# Patient Record
Sex: Female | Born: 1975 | Race: White | Hispanic: No | Marital: Married | Smoking: Never smoker
Health system: Southern US, Community
[De-identification: ages and names within clinical notes are randomized; demographics above are authoritative.]

## PROBLEM LIST (undated history)

## (undated) HISTORY — PX: BREAST SURGERY: SHX581

---

## 2010-04-10 ENCOUNTER — Other Ambulatory Visit: Payer: Self-pay | Admitting: Podiatry

## 2010-09-20 ENCOUNTER — Ambulatory Visit: Payer: Self-pay | Admitting: Obstetrics and Gynecology

## 2015-05-21 ENCOUNTER — Ambulatory Visit
Admission: EM | Admit: 2015-05-21 | Discharge: 2015-05-21 | Disposition: A | Discharge status: Home / Self Care | Payer: BC Managed Care – PPO | Attending: Family Medicine | Admitting: Family Medicine

## 2015-05-21 ENCOUNTER — Encounter: Payer: Self-pay | Admitting: *Deleted

## 2015-05-21 DIAGNOSIS — N390 Urinary tract infection, site not specified: Secondary | ICD-10-CM | POA: Diagnosis not present

## 2015-05-21 LAB — URINALYSIS COMPLETE WITH MICROSCOPIC (ARMC ONLY)
Bilirubin Urine: NEGATIVE
Glucose, UA: NEGATIVE mg/dL
Ketones, ur: NEGATIVE mg/dL
Nitrite: NEGATIVE
PH: 7 (ref 5.0–8.0)
SPECIFIC GRAVITY, URINE: 1.01 (ref 1.005–1.030)

## 2015-05-21 MED ORDER — PHENAZOPYRIDINE HCL 200 MG PO TABS: 200.0000 mg | ORAL_TABLET | Freq: Three times a day (TID) | ORAL | Status: AC

## 2015-05-21 MED ORDER — CEPHALEXIN 500 MG PO CAPS: 500.0000 mg | ORAL_CAPSULE | Freq: Two times a day (BID) | ORAL | Status: AC

## 2015-05-21 NOTE — Discharge Instructions (Signed)

## 2015-05-21 NOTE — ED Provider Notes (Signed)
CSN: [id number]     Arrival date & time 05/21/15  1049 History   First MD Initiated Contact with Patient 05/21/15 1109     Chief Complaint  Patient presents with  . Urinary Frequency   (Consider location/radiation/quality/duration/timing/severity/associated sxs/prior Treatment) HPI   This a 40 year old female who presents with urinary frequency with low back pain and burning is the end of micturition. She stated this started 5 days ago. She also feels bloated in the lower abdomen. She has no vaginal discharge. She is sexually active with a single partner.  History reviewed. No pertinent past medical history. History reviewed. No pertinent past surgical history. No family history on file. Social History  Substance Use Topics  . Smoking status: Never Smoker   . Smokeless tobacco: None  . Alcohol Use: No   OB History    No data available     Review of Systems  Constitutional: Positive for chills and activity change. Negative for fever and fatigue.  Genitourinary: Positive for dysuria, urgency, frequency and decreased urine volume. Negative for vaginal bleeding, vaginal discharge and vaginal pain.  All other systems reviewed and are negative.   Allergies  Review of patient's allergies indicates no known allergies.  Home Medications   Prior to Admission medications   Medication Sig Start Date End Date Taking? Authorizing Provider  cetirizine (ZYRTEC) 10 MG tablet Take 10 mg by mouth daily.   Yes Historical Provider, MD  cephALEXin (KEFLEX) 500 MG capsule Take 1 capsule (500 mg total) by mouth 2 (two) times daily. 05/21/15   Lutricia FeilWilliam P Roemer, PA-C  phenazopyridine (PYRIDIUM) 200 MG tablet Take 1 tablet (200 mg total) by mouth 3 (three) times daily. 05/21/15   Lutricia FeilWilliam P Roemer, PA-C   Meds Ordered and Administered this Visit  Medications - No data to display  BP 118/72 mmHg  Pulse 66  Temp(Src) 97.9 F (36.6 C) (Oral)  Ht 5\' 9"  (1.753 m)  Wt 145 lb (65.772 kg)  BMI 21.40  kg/m2  SpO2 100%  LMP 04/16/2015 (Approximate) No data found.   Physical Exam  Constitutional: She is oriented to person, place, and time. She appears well-developed and well-nourished. No distress.  HENT:  Head: Normocephalic and atraumatic.  Eyes: Conjunctivae are normal. Pupils are equal, round, and reactive to light.  Neck: Normal range of motion. Neck supple.  Pulmonary/Chest: Effort normal and breath sounds normal. No respiratory distress. She has no wheezes. She has no rales.  Abdominal: Soft. Bowel sounds are normal. She exhibits no distension and no mass. There is tenderness. There is no rebound and no guarding.  Musculoskeletal: Normal range of motion. She exhibits no edema or tenderness.  Neurological: She is alert and oriented to person, place, and time.  Skin: Skin is warm and dry. She is not diaphoretic.  Psychiatric: She has a normal mood and affect. Her behavior is normal. Judgment and thought content normal.  Nursing note and vitals reviewed.   ED Course  Procedures (including critical care time)  Labs Review Labs Reviewed  URINALYSIS COMPLETEWITH MICROSCOPIC ([facility] ONLY) - Abnormal; Notable for the following:    APPearance HAZY (*)    Hgb urine dipstick 2+ (*)    Protein, ur TRACE (*)    Leukocytes, UA 3+ (*)    Bacteria, UA FEW (*)    Squamous Epithelial / LPF 0-5 (*)    All other components within normal limits  URINE CULTURE    Imaging Review No results found.   Visual Acuity Review  Right Eye Distance:   Left Eye Distance:   Bilateral Distance:    Right Eye Near:   Left Eye Near:    Bilateral Near:         MDM   1. UTI (lower urinary tract infection)    Discharge Medication List as of 05/21/2015 11:51 AM    START taking these medications   Details  cephALEXin (KEFLEX) 500 MG capsule Take 1 capsule (500 mg total) by mouth 2 (two) times daily., Starting 05/21/2015, Until Discontinued, Normal    phenazopyridine (PYRIDIUM) 200 MG tablet  Take 1 tablet (200 mg total) by mouth 3 (three) times daily., Starting 05/21/2015, Until Discontinued, Normal      Plan: 1. Test/x-ray results and diagnosis reviewed with patient 2. rx as per orders; risks, benefits, potential side effects reviewed with patient 3. Recommend supportive treatment with Fluids. She will call us in 48 hours for results of the C&S if she does not hear from [place] sooner.  4. F/u prn if symptoms worsen or don't improve       Lutricia Feil, PA-C 05/21/15 1208

## 2015-05-21 NOTE — ED Notes (Signed)
Pt states that she has frequent urination that started about 6 days, lower back pain and burning with urination.

## 2015-05-24 LAB — URINE CULTURE: Culture: 20000 — AB

## 2015-07-12 ENCOUNTER — Encounter: Payer: Self-pay | Admitting: *Deleted

## 2015-07-12 ENCOUNTER — Ambulatory Visit
Admission: EM | Admit: 2015-07-12 | Discharge: 2015-07-12 | Disposition: A | Discharge status: Home / Self Care | Payer: BC Managed Care – PPO | Attending: Family Medicine | Admitting: Family Medicine

## 2015-07-12 DIAGNOSIS — J018 Other acute sinusitis: Secondary | ICD-10-CM

## 2015-07-12 MED ORDER — AMOXICILLIN-POT CLAVULANATE 875-125 MG PO TABS: 1.0000 | ORAL_TABLET | Freq: Two times a day (BID) | ORAL | Status: AC

## 2015-07-12 NOTE — ED Notes (Signed)
Patient has had symptoms of nasal congestion and sore throat for one week. OTC medications have not resolved symptoms. Patient also reports chest congestion.

## 2015-07-12 NOTE — ED Provider Notes (Addendum)
CSN: [id number]     Arrival date & time 07/12/15  1908 History   First MD Initiated Contact with Patient 07/12/15 1923     Chief Complaint  Patient presents with  . Sore Throat  . Nasal Congestion   (Consider location/radiation/quality/duration/timing/severity/associated sxs/prior Treatment) HPI: Patient presents with symptoms of nasal congestion and sore throat for a week. Patient states that the mucus is yellow-green in color. She has been taking some over-the-counter medications with some relief. She has had some chest congestion as well. She denies any chest pain, shortness of breath, high fever, severe headache.  History reviewed. No pertinent past medical history. History reviewed. No pertinent past surgical history. History reviewed. No pertinent family history. Social History  Substance Use Topics  . Smoking status: Never Smoker   . Smokeless tobacco: None  . Alcohol Use: No   OB History    No data available     Review of Systems: Negative except mentioned above.   Allergies  Review of patient's allergies indicates no known allergies.  Home Medications   Prior to Admission medications   Medication Sig Start Date End Date Taking? Authorizing Provider  cephALEXin (KEFLEX) 500 MG capsule Take 1 capsule (500 mg total) by mouth 2 (two) times daily. 05/21/15   Lutricia FeilWilliam P Roemer, PA-C  cetirizine (ZYRTEC) 10 MG tablet Take 10 mg by mouth daily.    Historical Provider, MD  phenazopyridine (PYRIDIUM) 200 MG tablet Take 1 tablet (200 mg total) by mouth 3 (three) times daily. 05/21/15   Lutricia FeilWilliam P Roemer, PA-C   Meds Ordered and Administered this Visit  Medications - No data to display  BP 120/80 mmHg  Pulse 70  Temp(Src) 98.1 F (36.7 C) (Oral)  Resp 18  Ht 5' 8.5" (1.74 m)  Wt 145 lb (65.772 kg)  BMI 21.72 kg/m2  SpO2 100%  LMP 06/30/2015 No data found.   Physical Exam   GENERAL: NAD HEENT: mild pharyngeal erythema, no exudate, no erythema of TMs, no cervical LAD,  mild maxillary sinus tenderness RESP: CTA B CARD: RRR NEURO: CN II-XII grossly intact   ED Course  Procedures (including critical care time)  Labs Review Labs Reviewed - No data to display  Imaging Review No results found.    MDM  A/P:Sinusitis-will treat with Augmentin, Claritin-D when necessary, Flonase when necessary, Motrin when necessary, rest, hydration, seek medical attention if symptoms persist or worsen as discussed.    Jolene ProvostKirtida Legrand Lasser, MD 07/12/15 1931  Jolene ProvostKirtida Krishauna Schatzman, MD 07/12/15 [id number]

## 2017-03-20 ENCOUNTER — Other Ambulatory Visit: Payer: Self-pay | Admitting: Obstetrics and Gynecology

## 2017-03-20 DIAGNOSIS — Z1231 Encounter for screening mammogram for malignant neoplasm of breast: Secondary | ICD-10-CM

## 2017-03-25 ENCOUNTER — Encounter (INDEPENDENT_AMBULATORY_CARE_PROVIDER_SITE_OTHER): Payer: Self-pay

## 2017-03-25 ENCOUNTER — Inpatient Hospital Stay: Admission: RE | Admit: 2017-03-25 | Payer: BC Managed Care – PPO | Source: Ambulatory Visit

## 2017-03-25 ENCOUNTER — Ambulatory Visit
Admission: RE | Admit: 2017-03-25 | Discharge: 2017-03-25 | Disposition: A | Discharge status: Home / Self Care | Payer: BC Managed Care – PPO | Source: Ambulatory Visit | Attending: Obstetrics and Gynecology | Admitting: Obstetrics and Gynecology

## 2017-03-25 DIAGNOSIS — Z1231 Encounter for screening mammogram for malignant neoplasm of breast: Secondary | ICD-10-CM | POA: Insufficient documentation

## 2018-09-04 ENCOUNTER — Other Ambulatory Visit: Payer: Self-pay | Admitting: Obstetrics and Gynecology

## 2018-09-04 DIAGNOSIS — Z1231 Encounter for screening mammogram for malignant neoplasm of breast: Secondary | ICD-10-CM

## 2018-09-21 ENCOUNTER — Other Ambulatory Visit: Payer: Self-pay

## 2018-09-21 ENCOUNTER — Ambulatory Visit
Admission: RE | Admit: 2018-09-21 | Discharge: 2018-09-21 | Disposition: A | Discharge status: Home / Self Care | Payer: BC Managed Care – PPO | Source: Ambulatory Visit | Attending: Obstetrics and Gynecology | Admitting: Obstetrics and Gynecology

## 2018-09-21 DIAGNOSIS — Z1231 Encounter for screening mammogram for malignant neoplasm of breast: Secondary | ICD-10-CM | POA: Insufficient documentation

## 2018-09-22 ENCOUNTER — Other Ambulatory Visit: Payer: Self-pay | Admitting: Obstetrics and Gynecology

## 2018-09-22 DIAGNOSIS — R928 Other abnormal and inconclusive findings on diagnostic imaging of breast: Secondary | ICD-10-CM

## 2018-09-22 DIAGNOSIS — N6489 Other specified disorders of breast: Secondary | ICD-10-CM

## 2018-09-30 ENCOUNTER — Ambulatory Visit
Admission: RE | Admit: 2018-09-30 | Discharge: 2018-09-30 | Disposition: A | Discharge status: Home / Self Care | Payer: BC Managed Care – PPO | Source: Ambulatory Visit | Attending: Obstetrics and Gynecology | Admitting: Obstetrics and Gynecology

## 2018-09-30 DIAGNOSIS — N6489 Other specified disorders of breast: Secondary | ICD-10-CM | POA: Insufficient documentation

## 2018-09-30 DIAGNOSIS — R928 Other abnormal and inconclusive findings on diagnostic imaging of breast: Secondary | ICD-10-CM | POA: Diagnosis not present

## 2018-10-02 ENCOUNTER — Other Ambulatory Visit: Payer: Self-pay | Admitting: Obstetrics and Gynecology

## 2018-10-02 DIAGNOSIS — R928 Other abnormal and inconclusive findings on diagnostic imaging of breast: Secondary | ICD-10-CM

## 2018-10-15 ENCOUNTER — Ambulatory Visit: Payer: BC Managed Care – PPO

## 2019-11-25 IMAGING — MG DIGITAL SCREENING BILATERAL MAMMOGRAM WITH TOMO AND CAD
6 of 10 series · 6 of 30 positions shown · non-contrast
Comparison: Previous exam(s).

CLINICAL DATA: Screening.

EXAM:
DIGITAL SCREENING BILATERAL MAMMOGRAM WITH TOMO AND CAD

[L MLO synth-2D]
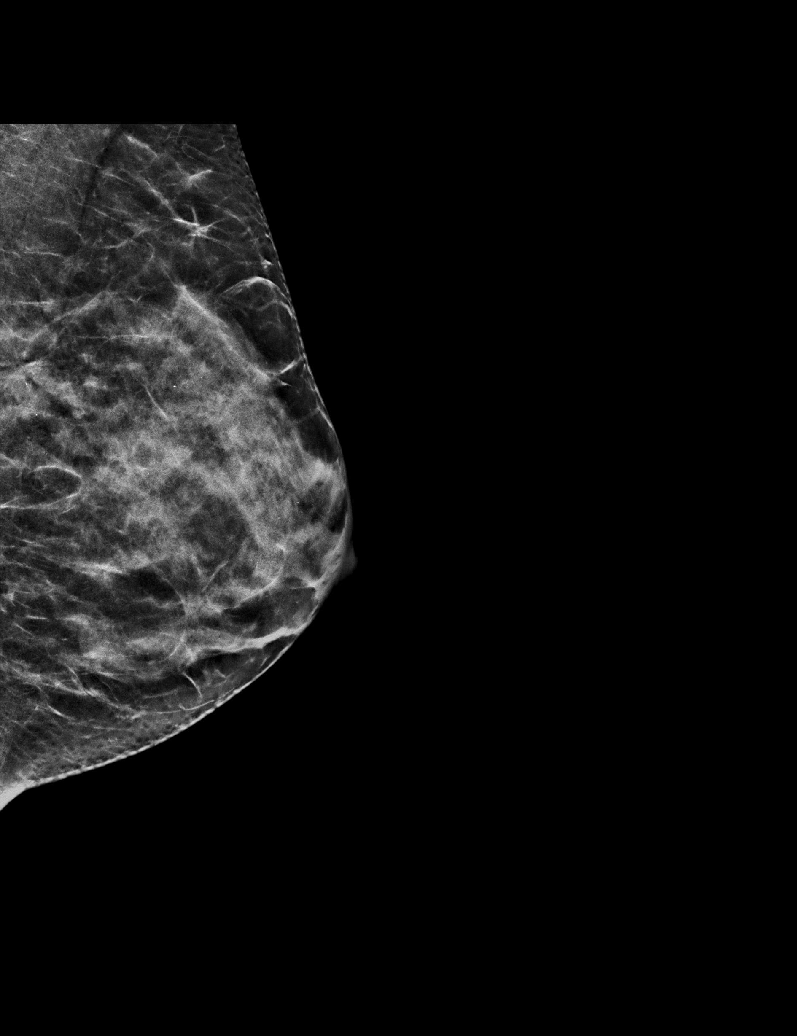

[R CC synth-2D (1 of 2)]
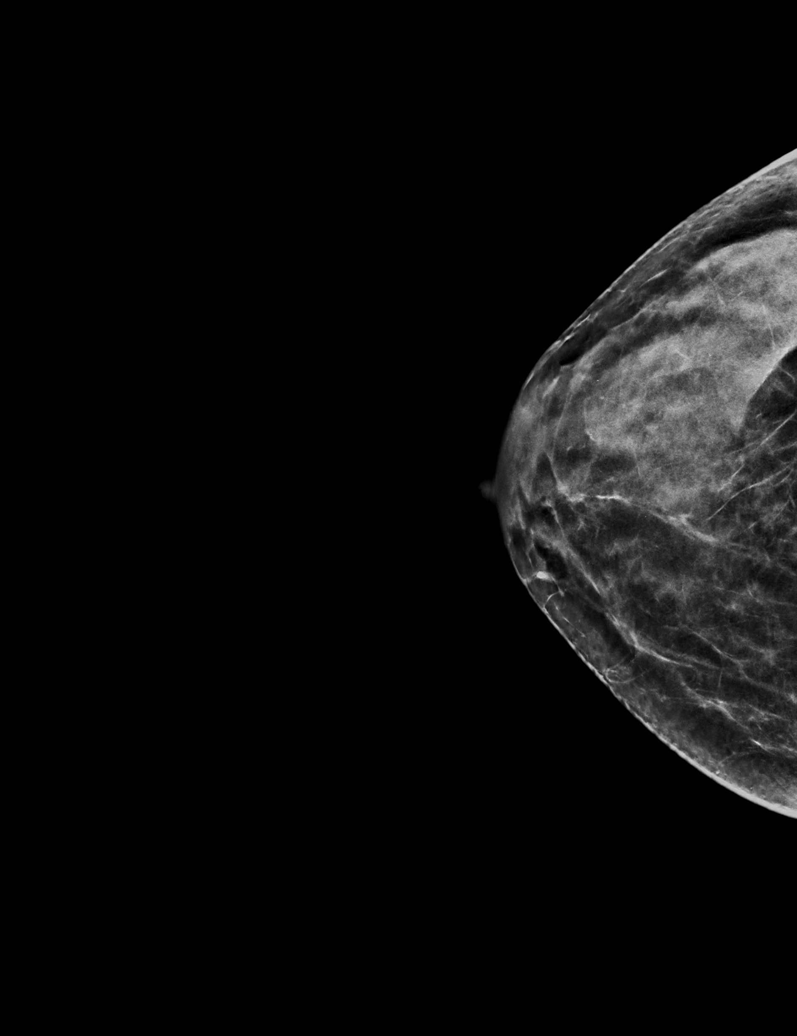

[R CC synth-2D (2 of 2)]
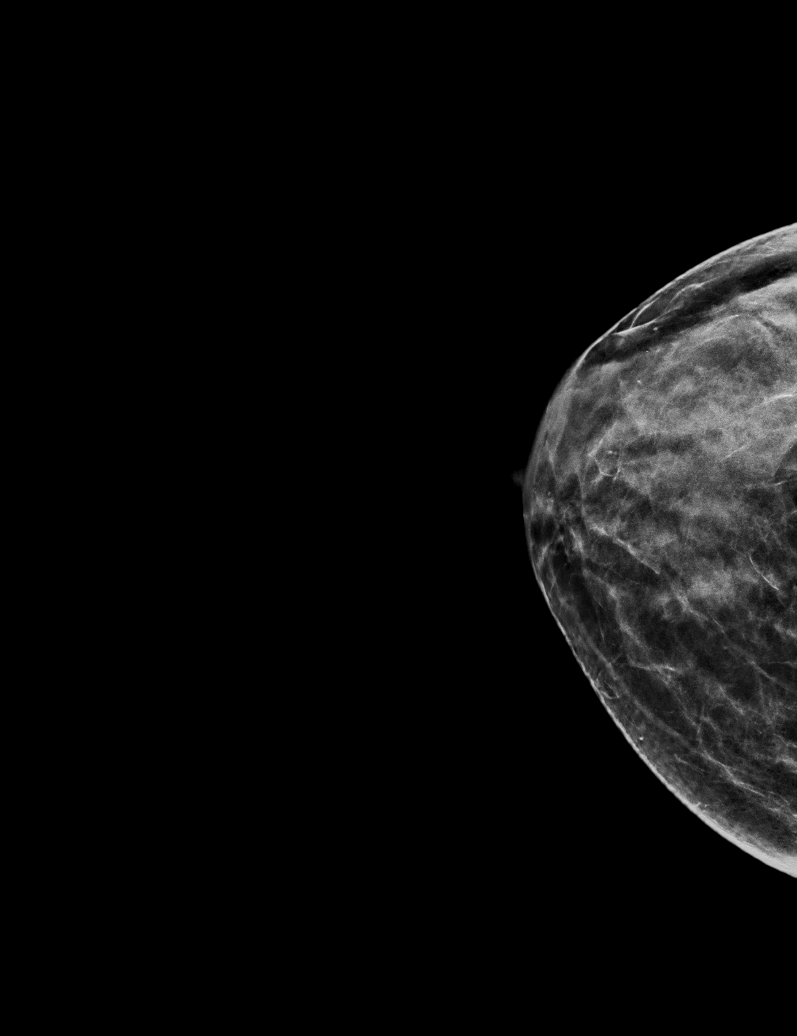

[R MLO synth-2D]
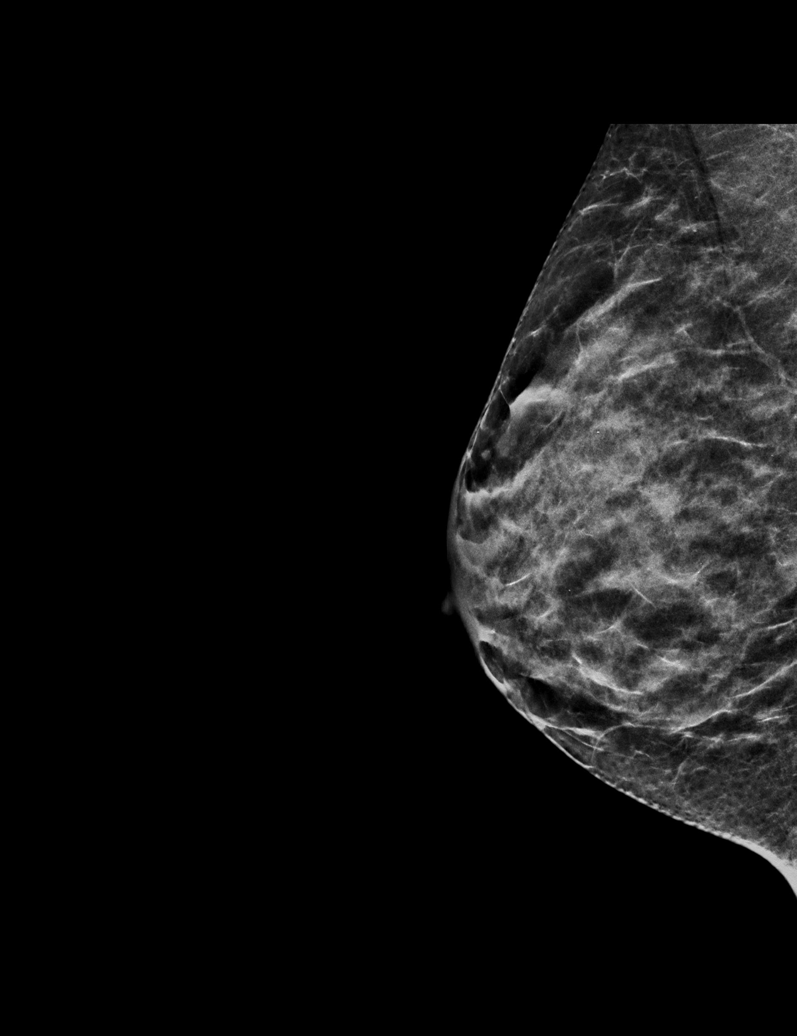

[L CC synth-2D]
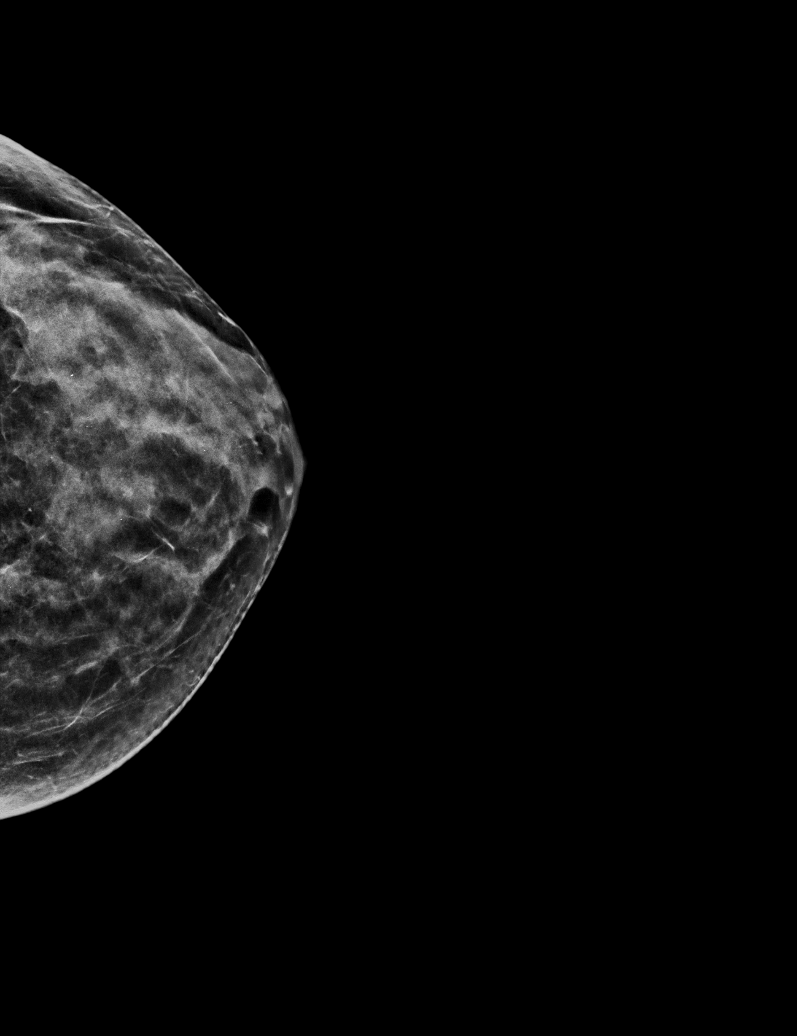

[R CC tomo · tomo slice 27/54.0]
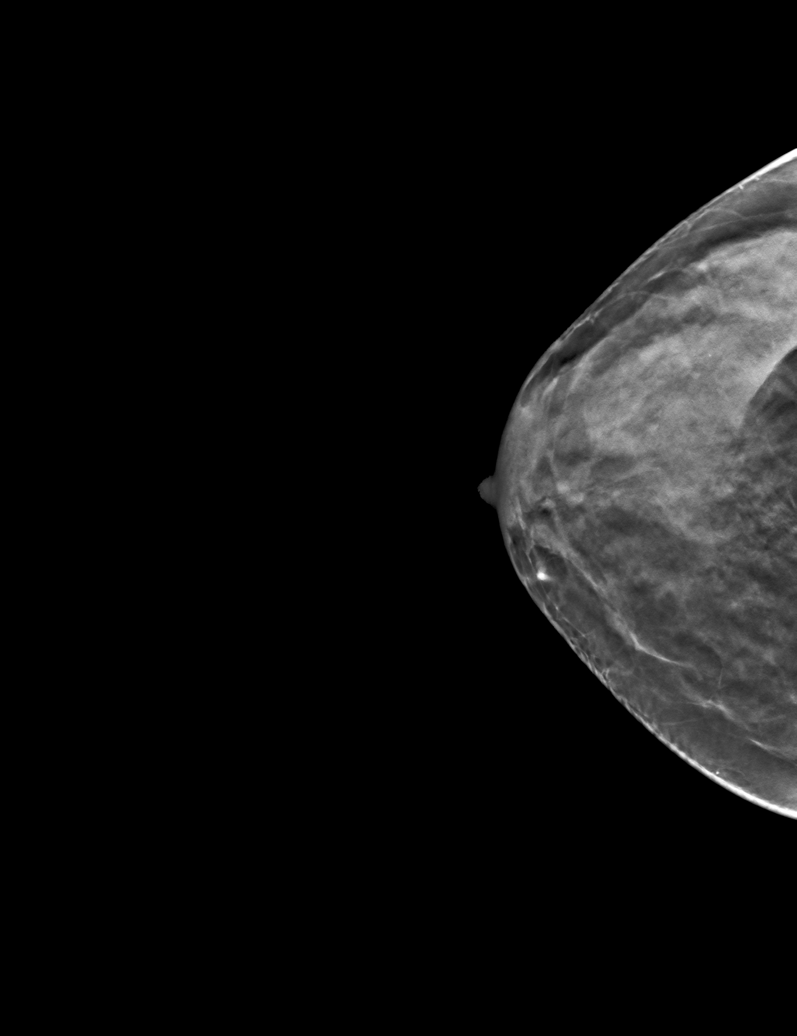

[6 of 30 positions shown; findings below may reference images not displayed]

ACR Breast Density Category c: The breast tissue is heterogeneously
dense, which may obscure small masses.
FINDINGS: In the left breast, a possible asymmetry warrants further
evaluation. In the right breast, no findings suspicious for
malignancy. Images were processed with CAD.
IMPRESSION: Further evaluation is suggested for possible asymmetry in the left
breast.

RECOMMENDATION:
Diagnostic mammogram and possibly ultrasound of the left breast.
(Code:[id number]-881)

The patient will be contacted regarding the findings, and additional
imaging will be scheduled.

BI-RADS CATEGORY  0: Incomplete. Need additional imaging evaluation
and/or prior mammograms for comparison.

## 2021-02-10 ENCOUNTER — Ambulatory Visit
Admission: EM | Admit: 2021-02-10 | Discharge: 2021-02-10 | Disposition: A | Discharge status: Home / Self Care | Payer: BC Managed Care – PPO | Attending: Physician Assistant | Admitting: Physician Assistant

## 2021-02-10 ENCOUNTER — Other Ambulatory Visit: Payer: Self-pay

## 2021-02-10 ENCOUNTER — Encounter: Payer: Self-pay | Admitting: Emergency Medicine

## 2021-02-10 DIAGNOSIS — R1032 Left lower quadrant pain: Secondary | ICD-10-CM | POA: Diagnosis not present

## 2021-02-10 DIAGNOSIS — R1031 Right lower quadrant pain: Secondary | ICD-10-CM

## 2021-02-10 DIAGNOSIS — N939 Abnormal uterine and vaginal bleeding, unspecified: Secondary | ICD-10-CM

## 2021-02-10 LAB — URINALYSIS, COMPLETE (UACMP) WITH MICROSCOPIC
Bilirubin Urine: NEGATIVE
Glucose, UA: NEGATIVE mg/dL
Ketones, ur: NEGATIVE mg/dL
Leukocytes,Ua: NEGATIVE
Nitrite: NEGATIVE
Protein, ur: NEGATIVE mg/dL
Specific Gravity, Urine: 1.01 (ref 1.005–1.030)
pH: 6 (ref 5.0–8.0)

## 2021-02-10 LAB — WET PREP, GENITAL
Clue Cells Wet Prep HPF POC: NONE SEEN
Sperm: NONE SEEN
Trich, Wet Prep: NONE SEEN
WBC, Wet Prep HPF POC: <10 — AB (ref <10)
Yeast Wet Prep HPF POC: NONE SEEN

## 2021-02-10 LAB — PREGNANCY, URINE: Preg Test, Ur: NEGATIVE

## 2021-02-10 NOTE — ED Provider Notes (Signed)
[facility]    CSN: [id number] Arrival date & time: 02/10/21  1357      History   Chief Complaint Chief Complaint  Patient presents with   Hematuria    HPI Kristi Warner is a 46 y.o. female presenting for 3-day history of blood when wiping.  Patient reports noting blood in the toilet bowl.  Denies urinary pain, frequency or urgency.  No back pain.  She does report some lower abdominal cramping.  Patient unsure if the blood is actually in the urine or if it is vaginal.  She reports that her last menstrual period was 3 weeks ago.  She says she is normally regular.  She is sexually active and states that they do use condoms.  Denies any fever, fatigue, nausea/vomiting or diarrhea, abnormal vaginal discharge/itching or odor.  No other complaints.  HPI  History reviewed. No pertinent past medical history.  There are no problems to display for this patient.   Past Surgical History:  Procedure Laterality Date   BREAST SURGERY      OB History   No obstetric history on file.      Home Medications    Prior to Admission medications   Medication Sig Start Date End Date Taking? Authorizing Provider  amoxicillin-clavulanate (AUGMENTIN) 875-125 MG tablet Take 1 tablet by mouth every 12 (twelve) hours. 07/12/15   Paulina Fusi, MD  cephALEXin (KEFLEX) 500 MG capsule Take 1 capsule (500 mg total) by mouth 2 (two) times daily. 05/21/15   Lorin Picket, PA-C  cetirizine (ZYRTEC) 10 MG tablet Take 10 mg by mouth daily.    [provider]  phenazopyridine (PYRIDIUM) 200 MG tablet Take 1 tablet (200 mg total) by mouth 3 (three) times daily. 05/21/15   Lorin Picket, PA-C    Family History Family History  Problem Relation Age of Onset   Breast cancer Paternal Aunt        2 pat aunts    Social History Social History   Tobacco Use   Smoking status: Never   Smokeless tobacco: Never  Vaping Use   Vaping Use: Never used  Substance Use Topics   Alcohol  use: No   Drug use: No     Allergies   Patient has no known allergies.   Review of Systems Review of Systems  Constitutional:  Negative for fatigue and fever.  Cardiovascular:  Negative for chest pain.  Gastrointestinal:  Positive for abdominal pain. Negative for diarrhea, nausea and vomiting.  Genitourinary:  Positive for hematuria and pelvic pain. Negative for dysuria, flank pain, frequency, urgency, vaginal discharge and vaginal pain.  Musculoskeletal:  Negative for back pain.  Skin:  Negative for rash.    Physical Exam Triage Vital Signs ED Triage Vitals  Enc Vitals Group     BP 02/10/21 1427 (!) 138/92     Pulse Rate 02/10/21 1427 72     Resp 02/10/21 1427 14     Temp 02/10/21 1427 98.8 F (37.1 C)     Temp Source 02/10/21 1427 Oral     SpO2 02/10/21 1427 100 %     Weight 02/10/21 1424 145 lb (65.8 kg)     Height 02/10/21 1424 5' 8.5" (1.74 m)     Head Circumference --      Peak Flow --      Pain Score 02/10/21 1424 6     Pain Loc --      Pain Edu? --  Excl. in GC? --    No data found.  Updated Vital Signs BP (!) 138/92 (BP Location: Left Arm)    Pulse 72    Temp 98.8 F (37.1 C) (Oral)    Resp 14    Ht 5' 8.5" (1.74 m)    Wt 145 lb (65.8 kg)    LMP 01/19/2021 (Exact Date)    SpO2 100%    BMI 21.73 kg/m   Physical Exam Vitals and nursing note reviewed.  Constitutional:      General: She is not in acute distress.    Appearance: Normal appearance. She is not ill-appearing or toxic-appearing.  HENT:     Head: Normocephalic and atraumatic.  Eyes:     General: No scleral icterus.       Right eye: No discharge.        Left eye: No discharge.     Conjunctiva/sclera: Conjunctivae normal.  Cardiovascular:     Rate and Rhythm: Normal rate and regular rhythm.     Heart sounds: Normal heart sounds.  Pulmonary:     Effort: Pulmonary effort is normal. No respiratory distress.     Breath sounds: Normal breath sounds.  Abdominal:     Palpations: Abdomen is  soft.     Tenderness: There is abdominal tenderness (RLQ, suprapubic, LLQ). There is no right CVA tenderness, left CVA tenderness, guarding or rebound.  Musculoskeletal:     Cervical back: Neck supple.  Skin:    General: Skin is dry.  Neurological:     General: No focal deficit present.     Mental Status: She is alert. Mental status is at baseline.     Motor: No weakness.     Gait: Gait normal.  Psychiatric:        Mood and Affect: Mood is anxious.        Speech: Speech is rapid and pressured.        Behavior: Behavior normal.        Thought Content: Thought content normal.     UC Treatments / Results  Labs (all labs ordered are listed, but only abnormal results are displayed) Labs Reviewed  WET PREP, GENITAL - Abnormal; Notable for the following components:      Result Value   WBC, Wet Prep HPF POC <10 (*)    All other components within normal limits  URINALYSIS, COMPLETE (UACMP) WITH MICROSCOPIC - Abnormal; Notable for the following components:   APPearance HAZY (*)    Hgb urine dipstick LARGE (*)    Bacteria, UA RARE (*)    All other components within normal limits  URINE CULTURE  PREGNANCY, URINE    EKG   Radiology No results found.  Procedures Procedures (including critical care time)  Medications Ordered in UC Medications - No data to display  Initial Impression / Assessment and Plan / UC Course  I have reviewed the triage vital signs and the nursing notes.  Pertinent labs & imaging results that were available during my care of the patient were reviewed by me and considered in my medical decision making (see chart for details).  46 year old female presenting for concerns about possible UTI.  Reports blood when wiping and in the toilet bowl.  Also reports pelvic cramping.  Last menstrual period was 3 weeks ago.  Patient is afebrile and overall well-appearing.  She is anxious and speaks quickly.  She says she is very concerned about her abdominal cramping  and feel like there is something "really wrong."  On exam she has tenderness palpation throughout the lower abdomen and suprapubic area.  No CVA tenderness, rebound or guarding.  Urinalysis shows large hemoglobin, otherwise normal.  We will send urine for culture to assess for UTI further but no concern for UTI especially given no urinary symptoms.  Wet prep obtained by patient shows no evidence of BV or yeast.  Patient reports blood on the swab that when she performed a vaginal self swab.  Discussed with patient doing a pelvic exam but she appears to decline.  Negative pregnancy test.  Advised patient symptoms consistent with irregular menstrual period and menstrual cramping.  Not concerned for UTI or renal stone.  Advised taking Midol and starting to use tampons and pads and increasing rest and fluids.  Advise going to ED if any heavy bleeding or if severe abdominal or pelvic pain or fever.  Otherwise, advised to make a follow-up appoint with PCP or OB/GYN this coming week if she still concerned or if her next menstrual period is also irregular.   Final Clinical Impressions(s) / UC Diagnoses   Final diagnoses:  Vaginal bleeding  Bilateral lower abdominal cramping     Discharge Instructions      -The urine test is not consistent with a UTI.  I will send the urine for culture to make sure and someone will call you in the next couple days if there is bacteria in the urine consistent with UTI and start you on antibiotics. -Pregnancy test is negative. - Your vaginal swab was negative for any sign of vaginal infection but given the blood on the swab, your bleeding is vaginal. - It looks like you have gotten your menstrual period a week early.  As we discussed, this is not extremely unusual, especially at your age where you may start to become perimenopausal if you continue to have irregular menstrual periods.  You can follow-up with your doctor about this.  You may contact them on Monday for an  appointment or contact your OB/GYN. - In the meantime, take Midol and begin using tampons or pads. - For any heavy bleeding or significantly worsening pelvic or abdominal pain, please go to emergency department where they may perform imaging to rule out other conditions such as ovarian cyst.   ED Prescriptions   None    PDMP not reviewed this encounter.   Danton Clap, PA-C 02/10/21 1540

## 2021-02-10 NOTE — Discharge Instructions (Signed)
-  The urine test is not consistent with a UTI.  I will send the urine for culture to make sure and someone will call you in the next couple days if there is bacteria in the urine consistent with UTI and start you on antibiotics. -Pregnancy test is negative. - Your vaginal swab was negative for any sign of vaginal infection but given the blood on the swab, your bleeding is vaginal. - It looks like you have gotten your menstrual period a week early.  As we discussed, this is not extremely unusual, especially at your age where you may start to become perimenopausal if you continue to have irregular menstrual periods.  You can follow-up with your doctor about this.  You may contact them on Monday for an appointment or contact your OB/GYN. - In the meantime, take Midol and begin using tampons or pads. - For any heavy bleeding or significantly worsening pelvic or abdominal pain, please go to emergency department where they may perform imaging to rule out other conditions such as ovarian cyst.

## 2021-02-10 NOTE — ED Triage Notes (Signed)
Patient reports some lower abdominal pressure and discomfort that started on Wed.  Patient states that she has also been having blood in her urine.  Patient reports urinary frequency. Patient denies N/V.

## 2021-02-11 LAB — URINE CULTURE: Culture: NO GROWTH
